# Patient Record
Sex: Male | Born: 1945 | Race: White | Hispanic: No | Marital: Married | Smoking: Former smoker
Health system: Southern US, Community
[De-identification: ages and names within clinical notes are randomized; demographics above are authoritative.]

## PROBLEM LIST (undated history)

## (undated) DIAGNOSIS — K529 Noninfective gastroenteritis and colitis, unspecified: Secondary | ICD-10-CM

## (undated) DIAGNOSIS — C61 Malignant neoplasm of prostate: Secondary | ICD-10-CM

## (undated) HISTORY — DX: Malignant neoplasm of prostate: C61

## (undated) HISTORY — DX: Noninfective gastroenteritis and colitis, unspecified: K52.9

---

## 2004-07-05 ENCOUNTER — Ambulatory Visit (HOSPITAL_COMMUNITY): Admission: RE | Admit: 2004-07-05 | Discharge: 2004-07-05 | Payer: Self-pay | Admitting: Internal Medicine

## 2004-07-05 ENCOUNTER — Ambulatory Visit: Payer: Self-pay | Admitting: Internal Medicine

## 2004-07-05 HISTORY — PX: COLONOSCOPY: SHX174

## 2008-10-15 DIAGNOSIS — K5289 Other specified noninfective gastroenteritis and colitis: Secondary | ICD-10-CM

## 2008-10-15 DIAGNOSIS — R109 Unspecified abdominal pain: Secondary | ICD-10-CM | POA: Insufficient documentation

## 2008-10-15 DIAGNOSIS — Z8719 Personal history of other diseases of the digestive system: Secondary | ICD-10-CM

## 2008-10-15 DIAGNOSIS — R198 Other specified symptoms and signs involving the digestive system and abdomen: Secondary | ICD-10-CM

## 2008-10-21 ENCOUNTER — Ambulatory Visit: Payer: Self-pay | Admitting: Internal Medicine

## 2008-10-22 ENCOUNTER — Encounter: Payer: Self-pay | Admitting: Internal Medicine

## 2008-11-24 ENCOUNTER — Ambulatory Visit: Payer: Self-pay | Admitting: Internal Medicine

## 2008-11-24 ENCOUNTER — Ambulatory Visit (HOSPITAL_COMMUNITY): Admission: RE | Admit: 2008-11-24 | Discharge: 2008-11-24 | Payer: Self-pay | Admitting: Internal Medicine

## 2008-11-24 ENCOUNTER — Encounter: Payer: Self-pay | Admitting: Internal Medicine

## 2008-11-24 HISTORY — PX: COLONOSCOPY: SHX174

## 2008-11-26 ENCOUNTER — Encounter: Payer: Self-pay | Admitting: Internal Medicine

## 2008-11-27 ENCOUNTER — Telehealth (INDEPENDENT_AMBULATORY_CARE_PROVIDER_SITE_OTHER): Payer: Self-pay

## 2008-12-01 ENCOUNTER — Encounter: Payer: Self-pay | Admitting: Internal Medicine

## 2008-12-08 ENCOUNTER — Telehealth (INDEPENDENT_AMBULATORY_CARE_PROVIDER_SITE_OTHER): Payer: Self-pay

## 2009-03-13 ENCOUNTER — Telehealth (INDEPENDENT_AMBULATORY_CARE_PROVIDER_SITE_OTHER): Payer: Self-pay

## 2009-04-24 ENCOUNTER — Ambulatory Visit (HOSPITAL_COMMUNITY): Admission: RE | Admit: 2009-04-24 | Discharge: 2009-04-24 | Payer: Self-pay | Admitting: Urology

## 2009-04-28 ENCOUNTER — Telehealth (INDEPENDENT_AMBULATORY_CARE_PROVIDER_SITE_OTHER): Payer: Self-pay | Admitting: *Deleted

## 2009-05-12 ENCOUNTER — Ambulatory Visit: Admission: RE | Admit: 2009-05-12 | Discharge: 2009-06-15 | Payer: Self-pay | Admitting: Radiation Oncology

## 2009-08-07 ENCOUNTER — Ambulatory Visit: Admission: RE | Admit: 2009-08-07 | Discharge: 2009-11-05 | Payer: Self-pay | Admitting: Radiation Oncology

## 2009-08-28 ENCOUNTER — Ambulatory Visit (HOSPITAL_BASED_OUTPATIENT_CLINIC_OR_DEPARTMENT_OTHER): Admission: RE | Admit: 2009-08-28 | Discharge: 2009-08-28 | Payer: Self-pay | Admitting: Urology

## 2009-11-10 ENCOUNTER — Ambulatory Visit: Admission: RE | Admit: 2009-11-10 | Discharge: 2009-12-02 | Payer: Self-pay | Admitting: Radiation Oncology

## 2010-01-26 ENCOUNTER — Ambulatory Visit: Payer: Self-pay | Admitting: Internal Medicine

## 2010-01-27 ENCOUNTER — Encounter: Payer: Self-pay | Admitting: Internal Medicine

## 2010-04-02 ENCOUNTER — Encounter (INDEPENDENT_AMBULATORY_CARE_PROVIDER_SITE_OTHER): Payer: Self-pay

## 2010-04-06 NOTE — Progress Notes (Signed)
Summary: CA Dx, would like to discuss how may affect GI treatment/MM  Phone Note Call from Patient Call back at Home Phone [phone]   Caller: Patient Call For: Dr. Jena Gauss Reason for Call: Talk to Doctor Details for Reason: Requested appt to discuss prostate cancer treatment w/ RMR prior to his urology appt; however, RMR has no office days prior to that date. Summary of Call: Zachary Bush asked if we could get a message to Dr. Elvera Lennox RE: his recent finding of prostate cancer and subsequent treatment.  He is currently being treated for mild colitis with Asacol 400mg  X 6 daily.  His prostate CA options have been described to him as radical surgery and / or radiation (seed implantation ERBT).  He would like opinion on how these treatments may affect his colitis (radiation mostly curious about b/c thats the way he is leaning).  Also mentioned if something may have to be done to his medications and just wanted the opportunity to run this by RMR prior to his urology appt. on March 3rd.  Please advise or contact patient to discuss. Initial call taken by: Minna Merritts,  April 28, 2009 4:07 PM  Follow-up for Phone Call        Provider Notified via documents please contact with follow up at phone # [phone] - pt states ok to leave voice mail if necessary     Appended Document: CA Dx, would like to discuss how may affect GI treatment/MM I spoke to Zachary Bush via phone.  Discussed GI and prostate issues.  I see no absolute contraindication to radiation therapy in this setting.  I did suggest he speak to radiation oncologist if XRT becomes a real option for him

## 2010-04-06 NOTE — Miscellaneous (Signed)
Summary: Orders Update  Clinical Lists Changes  Orders: Added new Test order of T-Creatinine Blood ([id number]) - Signed 

## 2010-04-06 NOTE — Progress Notes (Signed)
Summary: ? about Asacol  Phone Note Call from Patient Call back at Home Phone [phone]   Caller: Patient Summary of Call: pt called- he is about to run out of Asacol and wants to know if he should continue to take it. If he needs to continue he will need another Rx. please advise. Initial call taken by: Hendricks Limes LPN,  March 13, 2009 4:40 PM

## 2010-04-06 NOTE — Assessment & Plan Note (Signed)
Summary: fu on Asacol, questions about having a flex sig/ss   Visit Type:  Follow-up Visit Primary Care Provider:  Dimas Aguas  Chief Complaint:  F/U med check.  History of Present Illness: Followup sigmoid colitis on colonoscopy. Biopsies consistent with inflammatory bowel diseasea. 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 He has been on Asacol 800 mg b.i.d.- t.i.d. recently.No more bleeding, no diarrhea. 1 to 2 bowel movements daily.  No abdominal pain. He wonders if he could drop back to 2 tablets twice a day. He tells me he gets his medication from [place] i-generic mesalamine 400 mg tablets very cheaply; wants to know what I think about it this. Would like to know how long he'll have to remain on this medication. He's had symptoms really for  release 5 years or so; had inflammatory changes in 2006 on colonoscopy. Family history significant in that he had a first degree relative with colon cancer diagnosed at age 60. history of  prostate cancer and has had radiation seed implants placed previously. Tolerating Asacol well. Had a normal creatinine one year ago. He is slated to have a creatinine done in March of next year.    Current Medications (verified): 1)  Glucosamine .... Once Daily 2)  Asacol 400 Mg Tbec (Mesalamine) .... 2 By Mouth Three Times A Day 3)  Aspir-Low 81 Mg Tbec (Aspirin) .... As Needed For Pain  Allergies (verified): No Known Drug Allergies  Past History:  Past Surgical History: Last updated: 10/15/2008 None  Family History: Last updated: November 18, 2008 Father: Deceased age 67  CVA Mother: Deceased age 58  CVA Siblings: One brother and one sister  both healthy  Social History: Last updated: 11/18/2008 Marital Status: Married Children: one Occupation: Retired  Production designer, theatre/television/film Patient is a former smoker.  Alcohol Use - yes Illicit Drug Use - no Patient gets regular exercise.  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 At the problem and had taken a generic equivalent from [place].  Recommendations: Asacol 800 mg t.i.d. for the next 6 months ; we can then consider dropping the dose back to 2 tablets  twice daily for another 6 months and go from there. Serum creatinine in 3 months; will be drawn with PSA (elsewhere).  Unless something comes up, will plan to see this nice gentleman back in 6 months.  Given positive family history of colon cancer, will plan for another colonoscopy in 4 years.  Appended Document: Orders Update    Clinical Lists Changes  Orders: Added new Service order of Est. Patient Level III (20254) - Signed

## 2010-04-08 NOTE — Letter (Signed)
Summary: Recall, Labs Needed  [facility]  [address]   [place], [place] [address]   Phone: [phone]  Fax: [phone]    April 02, 2010  Zachary Bush [address] [place], [place]  [address] 1945-09-25   Dear [name],   Our records indicate it is time to repeat your blood work.  You can take the enclosed form to the lab on or near the date indicated.  Please make note of the new location of the lab:   [address], 2nd floor   [facility]  Our office will call you within a week to ten business days with the results.  If you do not hear from [place] in 10 business days, you should call the office.  If you have any questions regarding this, call the office at [phone], and ask for the nurse.  Labs are due on 04/29/10.   Sincerely,    Hendricks Limes LPN  [facility] Ph: [phone]   Fax: [phone]

## 2010-04-14 ENCOUNTER — Telehealth (INDEPENDENT_AMBULATORY_CARE_PROVIDER_SITE_OTHER): Payer: Self-pay

## 2010-04-22 NOTE — Progress Notes (Addendum)
Summary: ? about lab order  Phone Note Call from Patient Call back at Home Phone [phone]   Caller: Patient Summary of Call: pt called- received lab recall letter. pt has changed his mind since OV and will not be getting generic asacol from [place]. He stated the prices were about the same. pt wants to know if he still needs creatinine drawn. please advise Initial call taken by: Hendricks Limes LPN,  April 14, 2010 3:20 PM     Appended Document: ? about lab order everyone on mesalamine therapy should have a periodic assessment of renal function via serum creatinine determination.  Appended Document: ? about lab order pt said he is scheduled for other labs on 05/24/2010 and will get Creatinine checked then.  Appended Document: ? about lab order ok  Appended Document: ? about lab order pt called- his creatinine was 0.66.

## 2010-06-01 ENCOUNTER — Telehealth: Payer: Self-pay

## 2010-06-01 NOTE — Telephone Encounter (Signed)
Pt called- he left voicemail and stated he was calling to give RMR his last creatinine results. Pt had blood drawn on the 19th of March and his creatinine was 0.66. Per last phone note in [facility] pt needed it because of him taking Asacol.

## 2010-06-02 NOTE — Telephone Encounter (Signed)
Creatinine reported to be normal; need a hard copy to EMR

## 2010-06-03 NOTE — Telephone Encounter (Signed)
Called pt- LMOM asked him to let us know where he had labs done at so we could get a copy.

## 2010-06-04 NOTE — Telephone Encounter (Signed)
Requested 06/04/10/law

## 2010-06-04 NOTE — Telephone Encounter (Signed)
Pt called back, he got labs done at [facility]. Please request copy

## 2010-07-01 ENCOUNTER — Encounter: Payer: Self-pay | Admitting: Internal Medicine

## 2010-07-23 NOTE — Op Note (Signed)
NAME:  CAMDYN, BESKE             ACCOUNT NO.:  [id number]   MEDICAL RECORD NO.:  [id number]          PATIENT TYPE:  AMB   LOCATION:  DAY                           FACILITY:  [facility]   PHYSICIAN:  R. Roetta Sessions, M.D. DATE OF BIRTH:  [birth year]   DATE OF PROCEDURE:  07/05/2004  DATE OF DISCHARGE:                                 OPERATIVE REPORT   PROCEDURE PERFORMED:  Colonoscopy.   INDICATIONS FOR PROCEDURE:  The patient is a 65 year old gentleman with  positive family history of colorectal cancer in his sister in her 30s.  Mr.  Gunner last colonoscopy was at [facility] in 2000 when the patient  was found to have no significant abnormalities.  He is devoid of any lower  GI tract symptoms.  He is here for high risk screening.  This approach has  been discussed with the patient at length, potential risks, benefits and  alternatives have reviewed and questions answered.  He is agreeable.  Please  see documentation in medical record.   PROCEDURE NOTE:  Oxygen saturations, blood pressure, pulse and respirations  were monitored throughout the entirety of the procedure.  Conscious sedation  was Versed 4 mg IV, Demerol 100 mg IV in divided doses.   INSTRUMENT USED:  Olympus video chip system.   FINDINGS:  Digital rectal exam revealed no abnormalities.   ENDOSCOPIC ASSESSMENT:  Prep was good.   Rectum:  Examination of rectal mucosa including retroflex view of the anal  verge revealed no abnormalities.  Colon:  The colonic mucosa was surveyed from the rectosigmoid junction  through the left, transverse and right colon to the area of the appendiceal  orifice and ileocecal valve and cecum.  These structures were well seen and  photographed for the record.  From this level  the scope was slowly  withdrawn.  All previously imaged mucosal surfaces were again seen.  The  patient had a few scattered submucosal petechiae in the descending colon.  Otherwise colonic mucosa appeared  entirely normal.  The patient tolerated  the procedure well and was reacted in endoscopy.   IMPRESSION:  1.  Normal rectum.  2.  A few scattered areas of submucosal petechiae of descending colon.  I      doubt the clinical significance.      Otherwise normal colon.  Not mentioned above, terminal ileum was      intubated and distal 5 cm appeared normal.  Today's findings are      reassuring.   RECOMMENDATIONS:  Repeat high risk screening colonoscopy in five years.      RMR/MEDQ  D:  07/05/2004  T:  07/05/2004  Job:  [id number]   cc:   Selinda Flavin  [address]  [place]  [place] [address]  Fax: [phone]

## 2010-07-26 ENCOUNTER — Encounter: Payer: Self-pay | Admitting: Internal Medicine

## 2010-09-15 ENCOUNTER — Telehealth: Payer: Self-pay

## 2010-09-15 NOTE — Telephone Encounter (Signed)
Pt called- he was getting asacol from [place] d/t he didn't have any insurance since November 2011. Pt now has medicare part d and is requesting the rest of his refills sent to [facility]- mail order company for [employer]. [phone] fax #. Pt needs 90 day supplys. [place] has his original rx and cannot transfer the rest of his refills. Will need to be printed and faxed in.

## 2010-09-16 NOTE — Telephone Encounter (Signed)
Pt aware, he stated he doesn't have time for an ov and he would just continue to get it the way he's been getting it.

## 2010-09-16 NOTE — Telephone Encounter (Signed)
Dr Jena Gauss had planned on adjusting pt's dose of asacol since his last OV.  He needs OV 1st please to discuss.  Thanks

## 2011-02-14 ENCOUNTER — Encounter: Payer: Self-pay | Admitting: Internal Medicine

## 2011-02-15 ENCOUNTER — Ambulatory Visit (INDEPENDENT_AMBULATORY_CARE_PROVIDER_SITE_OTHER): Payer: Medicare Other | Admitting: Gastroenterology

## 2011-02-15 ENCOUNTER — Encounter: Payer: Self-pay | Admitting: Gastroenterology

## 2011-02-15 VITALS — BP 123/79 | HR 48 | Temp 98.0°F | Ht 73.0 in | Wt 224.0 lb

## 2011-02-15 DIAGNOSIS — K5289 Other specified noninfective gastroenteritis and colitis: Secondary | ICD-10-CM

## 2011-02-15 MED ORDER — MESALAMINE 400 MG PO TBEC: 800.0000 mg | DELAYED_RELEASE_TABLET | Freq: Three times a day (TID) | ORAL | Status: DC

## 2011-02-15 NOTE — Progress Notes (Signed)
Cc to PCP 

## 2011-02-15 NOTE — Patient Instructions (Signed)
Continue Asacol as before. You can decrease to two pills twice a day anytime you are ready. Return to office in one year or sooner if needed.

## 2011-02-15 NOTE — Assessment & Plan Note (Signed)
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 Patient can drop the dose to twice a day at any time. He prefers keeping 3 times a day dosing for now. We will get a copy of his last creatinine for our records. Office visit in one year or sooner if needed.

## 2011-02-15 NOTE — Progress Notes (Signed)
Primary Care Physician: Criss Rosales, MD  Primary Gastroenterologist:  Roetta Sessions, MD   Chief Complaint  Patient presents with  . Follow-up  . Medication Refill    HPI: Zachary Bush is a 65 y.o. male here for followup of colitis. He has indeterminate left-sided colitis, diagnosed in September 2010. He has been maintained on Asacol. He states he's doing very well. Denies abdominal pain, diarrhea, melena, rectal bleeding, weight loss, nausea or vomiting, heartburn, dysphagia. He continues to take Asacol 800 mg 3 times a day. He has not been interested in dropping back to twice daily dosing as he's been doing well and does not want to "rock the boat". He tells me had his yearly blood work in October by Dr. Selinda Flavin and everything looked good.  Current Outpatient Prescriptions  Medication Sig Dispense Refill  . mesalamine (ASACOL) 400 MG EC tablet Take 400 mg by mouth 3 (three) times daily.          Allergies as of 02/15/2011  . (No Known Allergies)    ROS:  General: Negative for anorexia, weight loss, fever, chills, fatigue, weakness. ENT: Negative for hoarseness, difficulty swallowing , nasal congestion. CV: Negative for chest pain, angina, palpitations, dyspnea on exertion, peripheral edema.  Respiratory: Negative for dyspnea at rest, dyspnea on exertion, cough, sputum, wheezing.  GI: See history of present illness. GU:  Negative for dysuria, hematuria, urinary incontinence, urinary frequency, nocturnal urination.  Endo: Negative for unusual weight change.    Physical Examination:   BP 123/79  Pulse 48  Temp(Src) 98 F (36.7 C) (Temporal)  Ht 6\' 1"  (1.854 m)  Wt 224 lb (101.606 kg)  BMI 29.55 kg/m2  General: Well-nourished, well-developed in no acute distress.  Eyes: No icterus. Mouth: Oropharyngeal mucosa moist and pink , no lesions erythema or exudate. Lungs: Clear to auscultation bilaterally.  Heart: Regular rate and rhythm, no murmurs rubs or gallops.    Abdomen: Bowel sounds are normal, nontender, nondistended, no hepatosplenomegaly or masses, no abdominal bruits or hernia , no rebound or guarding.   Extremities: No lower extremity edema. No clubbing or deformities. Neuro: Alert and oriented x 4   Skin: Warm and dry, no jaundice.   Psych: Alert and cooperative, normal mood and affect.

## 2011-03-08 NOTE — Progress Notes (Signed)
Labs 12/2010: Creatinine 1.03. Tbili 0.3, AP 81, AST 18, ALT 20, WBC 5800, H/H 13.8/41.9, Plt 209000

## 2012-02-06 ENCOUNTER — Encounter: Payer: Self-pay | Admitting: Internal Medicine

## 2012-02-10 ENCOUNTER — Telehealth: Payer: Self-pay | Admitting: Internal Medicine

## 2012-02-10 NOTE — Telephone Encounter (Signed)
Pt has questions about his prescription Asacol because his insurance is changing and has multiple questions about mail order and would like to get a written script of this, I told him that I would have the nurse that handles this call him back on Monday at [phone]

## 2012-02-13 NOTE — Telephone Encounter (Signed)
LMOM for a return call to set up OV

## 2012-02-13 NOTE — Telephone Encounter (Signed)
Pt is due for office visit. Zachary Bush is going to call and schedule.

## 2012-02-14 NOTE — Telephone Encounter (Signed)
Pt is aware of OV on 12/16 at 0830 with AS

## 2012-02-16 ENCOUNTER — Encounter: Payer: Self-pay | Admitting: Internal Medicine

## 2012-02-20 ENCOUNTER — Encounter: Payer: Self-pay | Admitting: Gastroenterology

## 2012-02-20 ENCOUNTER — Ambulatory Visit (INDEPENDENT_AMBULATORY_CARE_PROVIDER_SITE_OTHER): Payer: Medicare Other | Admitting: Gastroenterology

## 2012-02-20 VITALS — BP 129/63 | HR 49 | Temp 97.9°F | Ht 72.0 in | Wt 221.8 lb

## 2012-02-20 DIAGNOSIS — K5289 Other specified noninfective gastroenteritis and colitis: Secondary | ICD-10-CM

## 2012-02-20 MED ORDER — MESALAMINE 400 MG PO CPDR: 800.0000 mg | DELAYED_RELEASE_CAPSULE | Freq: Three times a day (TID) | ORAL | Status: DC

## 2012-02-20 NOTE — Progress Notes (Signed)
Referring Provider: Selinda Flavin, MD Primary Care Physician:  Selinda Flavin, MD Primary GI: Dr. Jena Gauss   Chief Complaint  Patient presents with  . Follow-up    doing good/need a Rx writen out    HPI:   66 year old male presents today in f/u of left-sided indeterminate colitis. He has been maintained on Asacol 800 tid; he has been hesitant to decrease dose to BID. He presents today for routine follow-up and further prescriptions. Would like to keep at TID. Has been playing around with it. No change in bowel habits, no rectal bleeding.   Past Medical History  Diagnosis Date  . Colitis     Biopsies indeterminate, doubt Crohn's  . Prostate cancer     seed implants 08/2009    Past Surgical History  Procedure Date  . Colonoscopy 11/24/08    normal rectum, intense patchy areas of submucosal petechiae with overlying erosions, with small areas of intervening normal-appearing mucosa from sigmoid colon to rectosigmoid junction. Bx-favor IBDSSurveillance Sept 2015  . Colonoscopy  07/05/2004    RMR: Normal rectum/  A few scattered areas of submucosal petechiae of descending colon/ Otherwise normal colon    Current Outpatient Prescriptions  Medication Sig Dispense Refill  . Mesalamine (DELZICOL) 400 MG CPDR Take 2 capsules (800 mg total) by mouth 3 (three) times daily.  410 capsule  3    Allergies as of 02/20/2012  . (No Known Allergies)    Family History  Problem Relation Age of Onset  . Stroke Father 65    deceased  . Stroke Mother 41    deceased  . Colon cancer Sister 81    History   Social History  . Marital Status: Married    Spouse Name: N/A    Number of Children: N/A  . Years of Education: N/A   Occupational History  . retired Production designer, theatre/television/film    Social History Main Topics  . Smoking status: Former Games developer  . Smokeless tobacco: None  . Alcohol Use: No  . Drug Use: No  . Sexually Active: None   Other Topics Concern  . None   Social History Narrative  . None     Review of Systems: Gen: Denies fever, chills, anorexia. Denies fatigue, weakness, weight loss.  CV: Denies chest pain, palpitations, syncope, peripheral edema, and claudication. Resp: Denies dyspnea at rest, cough, wheezing, coughing up blood, and pleurisy. GI: SEE HPI Derm: Denies rash, itching, dry skin Psych: Denies depression, anxiety, memory loss, confusion. No homicidal or suicidal ideation.  Heme: Denies bruising, bleeding, and enlarged lymph nodes.  Physical Exam: BP 129/63  Pulse 49  Temp 97.9 F (36.6 C) (Oral)  Ht 6' (1.829 m)  Wt 221 lb 12.8 oz (100.608 kg)  BMI 30.08 kg/m2 General:   Alert and oriented. No distress noted. Pleasant and cooperative.  Head:  Normocephalic and atraumatic. Eyes:  Conjuctiva clear without scleral icterus. Mouth:  Oral mucosa pink and moist. Good dentition. No lesions. Neck:  Supple, without mass or thyromegaly. Heart:  S1, S2 present without murmurs, rubs, or gallops. Regular rate and rhythm. Abdomen:  +BS, soft, non-tender and non-distended. No rebound or guarding. No HSM or masses noted. Msk:  Symmetrical without gross deformities. Normal posture. Extremities:  Without edema. Neurologic:  Alert and  oriented x4;  grossly normal neurologically. Skin:  Intact without significant lesions or rashes. Cervical Nodes:  No significant cervical adenopathy. Psych:  Alert and cooperative. Normal mood and affect.

## 2012-02-20 NOTE — Assessment & Plan Note (Signed)
66 year old male with hx of left-sided indeterminate colitis, doing well on Dezicol 800 mg TID. Have informed pt may decrease to BID if tolerated. Due for surveillance TCS in Sept 2015. Prescription provided, return in 2 years. Set up TCS at that time.

## 2012-02-20 NOTE — Progress Notes (Signed)
Faxed to PCP

## 2012-02-20 NOTE — Patient Instructions (Addendum)
Continue taking Delzicol 2 capsules three times a day. You may decrease this to 2 capsules twice a day if tolerated.   We will see you back in 2 years!  Merry Christmas!

## 2012-11-29 NOTE — Progress Notes (Signed)
Labs from October 2013:   BUN and Cr normal.  LFTs normal, to be abstracted.  Hgb 13.8

## 2012-12-03 LAB — CBC
HCT: 40 %
HGB: 13.8 g/dL
platelet count: 204

## 2013-03-13 ENCOUNTER — Encounter: Payer: Self-pay | Admitting: Gastroenterology

## 2013-03-13 ENCOUNTER — Ambulatory Visit (INDEPENDENT_AMBULATORY_CARE_PROVIDER_SITE_OTHER): Payer: Medicare Other | Admitting: Gastroenterology

## 2013-03-13 ENCOUNTER — Encounter (INDEPENDENT_AMBULATORY_CARE_PROVIDER_SITE_OTHER): Payer: Self-pay

## 2013-03-13 VITALS — BP 139/74 | HR 46 | Temp 98.0°F | Wt 215.8 lb

## 2013-03-13 DIAGNOSIS — K5289 Other specified noninfective gastroenteritis and colitis: Secondary | ICD-10-CM

## 2013-03-13 MED ORDER — MESALAMINE 400 MG PO CPDR: 800.0000 mg | DELAYED_RELEASE_CAPSULE | Freq: Three times a day (TID) | ORAL | Status: AC

## 2013-03-13 NOTE — Progress Notes (Signed)
    Referring Provider: Rory Percy, MD Primary Care Physician:  Rory Percy, MD Primary GI: Dr. Gala Romney  Chief Complaint  Patient presents with  . Medication Refill    HPI:   Zachary Bush presents today in follow-up of left-sided indeterminate colitis. Maintained on Delzicol 800 TID, unable to tolerate dosing at BID. Here for annual follow-up.   Denies abdominal pain, flares. Tried to decrease Delzicol down to BID and could not tolerate. No rectal bleeding. Normally one BM a day, sometimes two. No reflux or heartburn. No dysphagia.    Past Medical History  Diagnosis Date  . Colitis     Biopsies indeterminate, doubt Crohn's  . Prostate cancer     seed implants 08/2009    Past Surgical History  Procedure Laterality Date  . Colonoscopy  11/24/08    normal rectum, intense patchy areas of submucosal petechiae with overlying erosions, with small areas of intervening normal-appearing mucosa from sigmoid colon to rectosigmoid junction. Bx-favor IBDSSurveillance Sept 2015  . Colonoscopy   07/05/2004    RMR: Normal rectum/  A few scattered areas of submucosal petechiae of descending colon/ Otherwise normal colon    Current Outpatient Prescriptions  Medication Sig Dispense Refill  . Mesalamine (DELZICOL) 400 MG CPDR Take 2 capsules (800 mg total) by mouth 3 (three) times daily.  410 capsule  3   No current facility-administered medications for this visit.    Allergies as of 03/13/2013  . (No Known Allergies)    Family History  Problem Relation Age of Onset  . Stroke Father 78    deceased  . Stroke Mother 84    deceased  . Colon cancer Sister 58    History   Social History  . Marital Status: Married    Spouse Name: N/A    Number of Children: N/A  . Years of Education: N/A   Occupational History  . retired Freight forwarder    Social History Main Topics  . Smoking status: Former Research scientist (life sciences)  . Smokeless tobacco: None  . Alcohol Use: No  . Drug Use: No  . Sexual  Activity: None   Other Topics Concern  . None   Social History Narrative  . None    Review of Systems: Negative unless mentioned in HPI.  Physical Exam: BP 139/74  Pulse 46  Temp(Src) 98 F (36.7 C) (Oral)  Wt 215 lb 12.8 oz (97.886 kg) General:   Alert and oriented. No distress noted. Pleasant and cooperative.  Head:  Normocephalic and atraumatic. Eyes:  Conjuctiva clear without scleral icterus. Mouth:  Oral mucosa pink and moist. Good dentition. No lesions. Heart:  S1, S2 present without murmurs, rubs, or gallops. Regular rate and rhythm. Abdomen:  +BS, soft, non-tender and non-distended. No rebound or guarding. No HSM or masses noted. Msk:  Symmetrical without gross deformities. Normal posture. Extremities:  Without edema. Neurologic:  Alert and  oriented x4;  grossly normal neurologically. Skin:  Intact without significant lesions or rashes. Cervical Nodes:  No significant cervical adenopathy. Psych:  Alert and cooperative. Normal mood and affect.

## 2013-03-13 NOTE — Assessment & Plan Note (Signed)
History of indeterminate left-sided colitis in clinical remission on Delzicol 800 TID. Unable to decrease dosage. From a GI perspective, doing well.  Next surveillance colonoscopy due in Nov 2015. Will have patient return in Oct this year to schedule. Will also obtain outside labs to include renal and hepatic function.

## 2013-03-13 NOTE — Patient Instructions (Signed)
Continue to take Delzicol 2 capsules three times a day.   We will see you back around October to schedule your colonoscopy.   Have a great 2015!

## 2013-03-14 NOTE — Progress Notes (Signed)
cc'd to pcp 

## 2013-06-12 NOTE — Progress Notes (Signed)
Outside labs reviewed from Sept 2014. Good renal function, normal LFTs, Hgb 13.6. Will be abstracted.

## 2013-06-18 LAB — COMPREHENSIVE METABOLIC PANEL
ALT: 19 U/L (ref 10–40)
AST: 17 U/L
Alkaline Phosphatase: 71 U/L
BUN: 13 mg/dL (ref 4–21)
Creat: 0.99
HEMATOCRIT: 41 %
HGB: 13.6 g/dL
Total Bilirubin: 0.4 mg/dL

## 2013-09-09 ENCOUNTER — Telehealth: Payer: Self-pay | Admitting: Internal Medicine

## 2013-09-09 NOTE — Telephone Encounter (Signed)
Patient is on recall to have his next tcs in Sept 2015 with RMR, but he is moving and wants to wrap things up here before relocating. He would like to be scheduled this month possibly on a Monday and he prefers mornings.  He did say that last time RMR prescribed him the prep in pill form and requested that he takes that again. I told him normally RMR doesn't do that, but I would document the request for a pill  Prep instead of liquid. Please call [phone] or [phone]. He did give permission to speak with his wife, Thayer Headings, if he wasn't available.

## 2013-09-10 NOTE — Telephone Encounter (Signed)
I called pt and told him I will check and see if he can be triaged and scheduled. I will try to call him back by tomorrow.  Zachary Bush, please advise!

## 2013-09-10 NOTE — Telephone Encounter (Signed)
Per Vicente Males  it's okay to triage if the patient doesn't have any other GI problems.   Routing to [employer]

## 2013-09-10 NOTE — Telephone Encounter (Signed)
History of indeterminate left-sided colitis in clinical remission on Delzicol 800 TID. Unable to decrease dosage. From a GI perspective, doing well. Next surveillance colonoscopy due this year. I am copying Rosendo Gros on this. It is a surveillance colonoscopy; I don't know how this one should be handled.

## 2013-09-10 NOTE — Telephone Encounter (Signed)
No, do not recommend pills.

## 2013-09-10 NOTE — Telephone Encounter (Signed)
Zachary Bush, is it ok to triage?

## 2013-09-11 ENCOUNTER — Other Ambulatory Visit: Payer: Self-pay

## 2013-09-11 ENCOUNTER — Telehealth: Payer: Self-pay

## 2013-09-11 DIAGNOSIS — Z1211 Encounter for screening for malignant neoplasm of colon: Secondary | ICD-10-CM

## 2013-09-12 NOTE — Telephone Encounter (Signed)
Gastroenterology Pre-Procedure Review  Request Date: 09/11/2013 Requesting Physician:   Pt's last colonoscopy was done by Dr.Rourk 11/24/2008 and next due in 5 years He will be moving later in August and request to have it done now  PATIENT REVIEW QUESTIONS: The patient responded to the following health history questions as indicated:    1. Diabetes Melitis: no 2. Joint replacements in the past 12 months: no 3. Major health problems in the past 3 months: no 4. Has an artificial valve or MVP: no 5. Has a defibrillator: no 6. Has been advised in past to take antibiotics in advance of a procedure like teeth cleaning: no    MEDICATIONS & ALLERGIES:    Patient reports the following regarding taking any blood thinners:   Plavix? no Aspirin? no Coumadin? no  Patient confirms/reports the following medications:  Current Outpatient Prescriptions  Medication Sig Dispense Refill  . Mesalamine (DELZICOL) 400 MG CPDR DR capsule Take 2 capsules (800 mg total) by mouth 3 (three) times daily.  540 capsule  3   No current facility-administered medications for this visit.    Patient confirms/reports the following allergies:  No Known Allergies  No orders of the defined types were placed in this encounter.    AUTHORIZATION INFORMATION Primary Insurance:   ID #:  Group #:  Pre-Cert / Auth required: Pre-Cert / Auth #:   Secondary Insurance:  ID #:   Group #:  Pre-Cert / Auth required:  Pre-Cert / Auth #:   SCHEDULE INFORMATION: Procedure has been scheduled as follows:  Date: 10/16/2013                 Time:  8:30 AM Location: [facility] Short Stay  This Gastroenterology Pre-Precedure Review Form is being routed to the following provider(s): R. Garfield Cornea, MD

## 2013-09-12 NOTE — Telephone Encounter (Signed)
History of left-sided indeterminate colitis. Maintained on Delzicol 800 TID. Due for surveillance now. Appropriate for triage, cleared with office manager to triage.

## 2013-09-13 MED ORDER — PEG-KCL-NACL-NASULF-NA ASC-C 100 G PO SOLR: 1.0000 | ORAL | Status: AC

## 2013-09-13 NOTE — Telephone Encounter (Signed)
Rx sent to the pharmacy and instructions mailed to pt.  

## 2013-10-07 ENCOUNTER — Telehealth: Payer: Self-pay

## 2013-10-07 NOTE — Telephone Encounter (Signed)
Tried to call pt to update triage. He is scheduled for colonoscopy on 10/16/2013 at 8:30.  Many rings and no answer. Mailing letter for him to call to update triage.

## 2013-10-08 ENCOUNTER — Encounter (HOSPITAL_COMMUNITY): Payer: Self-pay | Admitting: Pharmacy Technician

## 2013-10-14 ENCOUNTER — Telehealth: Payer: Self-pay

## 2013-10-14 NOTE — Telephone Encounter (Signed)
Pt called and said he wants to make sure that he does not get general anesthesia for his prep on 10/16/2013. I told him he is scheduled for Endo and they are just given some meds in IV to relax. He states that he is not allergic to any meds, but he had procedure once and was knocked out and he will NOT be happy if he is knocked out.  He said if it does not go well, he will call the president to complain. Please advise!

## 2013-10-14 NOTE — Telephone Encounter (Signed)
Noted  

## 2013-10-14 NOTE — Telephone Encounter (Signed)
I spoke with patient. He received versed 6mg , demerol 125mg  at time of TCS in 2010. He felt very "wiped out" and "hung over" for couple of days and doesn't want to feel like that again.   He felt great after his TCS in 2006 when he received, Versed 4mg /demerol 100mg . And in 2001, versed 2mg /demerol 50mg .   He would like to use smallest amount of sedation as possible just to knock "edge off". He doesn't mind being awake.   I will inform Dr. Gala Romney and asked patient to mention day of procedure as well.

## 2013-10-15 NOTE — Telephone Encounter (Signed)
LMOM to call and let us know if there has been any change in meds. Also, I left VM that Magda Paganini has made his request known to Dr. Gala Romney in reference to the sedation for his procedure.

## 2013-10-16 ENCOUNTER — Ambulatory Visit (HOSPITAL_COMMUNITY)
Admission: RE | Admit: 2013-10-16 | Discharge: 2013-10-16 | Disposition: A | Discharge status: Home / Self Care | Payer: Medicare Other | Source: Ambulatory Visit | Attending: Internal Medicine | Admitting: Internal Medicine

## 2013-10-16 ENCOUNTER — Encounter (HOSPITAL_COMMUNITY): Payer: Self-pay | Admitting: *Deleted

## 2013-10-16 ENCOUNTER — Encounter (HOSPITAL_COMMUNITY)
Admission: RE | Disposition: A | Discharge status: Home / Self Care | Payer: Self-pay | Source: Ambulatory Visit | Attending: Internal Medicine

## 2013-10-16 DIAGNOSIS — Z87891 Personal history of nicotine dependence: Secondary | ICD-10-CM | POA: Insufficient documentation

## 2013-10-16 DIAGNOSIS — D126 Benign neoplasm of colon, unspecified: Secondary | ICD-10-CM | POA: Insufficient documentation

## 2013-10-16 DIAGNOSIS — Z1211 Encounter for screening for malignant neoplasm of colon: Secondary | ICD-10-CM | POA: Diagnosis present

## 2013-10-16 DIAGNOSIS — Z86010 Personal history of colonic polyps: Secondary | ICD-10-CM

## 2013-10-16 DIAGNOSIS — Z8546 Personal history of malignant neoplasm of prostate: Secondary | ICD-10-CM | POA: Insufficient documentation

## 2013-10-16 DIAGNOSIS — K5289 Other specified noninfective gastroenteritis and colitis: Secondary | ICD-10-CM | POA: Insufficient documentation

## 2013-10-16 DIAGNOSIS — Z800 Family history of malignant neoplasm of digestive organs: Secondary | ICD-10-CM | POA: Insufficient documentation

## 2013-10-16 HISTORY — PX: COLONOSCOPY: SHX5424

## 2013-10-16 LAB — BASIC METABOLIC PANEL
ANION GAP: 12 (ref 5–15)
BUN: 8 mg/dL (ref 6–23)
CALCIUM: 9.1 mg/dL (ref 8.4–10.5)
CO2: 25 meq/L (ref 19–32)
CREATININE: 0.87 mg/dL (ref 0.50–1.35)
Chloride: 103 mEq/L (ref 96–112)
GFR calc Af Amer: >90 mL/min (ref >90)
GFR calc non Af Amer: 87 mL/min — ABNORMAL LOW (ref >90)
Glucose, Bld: 87 mg/dL (ref 70–99)
Potassium: 4.4 mEq/L (ref 3.7–5.3)
Sodium: 140 mEq/L (ref 137–147)

## 2013-10-16 SURGERY — COLONOSCOPY
Anesthesia: Moderate Sedation

## 2013-10-16 MED ORDER — SODIUM CHLORIDE 0.9 % IV SOLN
INTRAVENOUS | Status: DC
  Administered 2013-10-16: 08:00:00 via INTRAVENOUS

## 2013-10-16 MED ORDER — ONDANSETRON HCL 4 MG/2ML IJ SOLN
INTRAMUSCULAR | Status: AC
  Filled 2013-10-16: qty 2

## 2013-10-16 MED ORDER — STERILE WATER FOR IRRIGATION IR SOLN
Status: DC | PRN
  Administered 2013-10-16: 09:00:00

## 2013-10-16 MED ORDER — MIDAZOLAM HCL 5 MG/5ML IJ SOLN
INTRAMUSCULAR | Status: AC
  Filled 2013-10-16: qty 10

## 2013-10-16 MED ORDER — MEPERIDINE HCL 100 MG/ML IJ SOLN
INTRAMUSCULAR | Status: AC
  Filled 2013-10-16: qty 2

## 2013-10-16 NOTE — Op Note (Signed)
[facility] [address] [place], [address]   COLONOSCOPY PROCEDURE REPORT  PATIENT: Zachary Bush, Zachary Bush  MR#:         [id number] BIRTHDATE: March 19, 1945 , 68  yrs. old GENDER: Male ENDOSCOPIST: R.  Garfield Cornea, MD FACP FACG REFERRED BY:  Rory Percy, M.D. PROCEDURE DATE:  10/16/2013 PROCEDURE:     Ileocolonoscopy with snare polypectomy and biopsy  INDICATIONS: Positive family history of colon cancer; history of left-sided indeterminate colitis; history of radiation implants for prostate cancer.  Currently, patient not having any colitis symptoms, whatsoever.  INFORMED CONSENT:  The risks, benefits, alternatives and imponderables including but not limited to bleeding, perforation as well as the possibility of a missed lesion have been reviewed.  The potential for biopsy, lesion removal, etc. have also been discussed.  Questions have been answered.  All parties agreeable. Please see the history and physical in the medical record for more information.  MEDICATIONS: None given per plan.  DESCRIPTION OF PROCEDURE:  After a digital rectal exam was performed, the EC-3890Li ([id number])  colonoscope was advanced from the anus through the rectum and colon to the area of the cecum, ileocecal valve and appendiceal orifice.  The cecum was deeply intubated.  These structures were well-seen and photographed for the record.  From the level of the cecum and ileocecal valve, the scope was slowly and cautiously withdrawn.  The mucosal surfaces were carefully surveyed utilizing scope tip deflection to facilitate fold flattening as needed.  The scope was pulled down into the rectum where a thorough examination including retroflexion was performed.    FINDINGS:  Adequate preparation. Normal-appearing rectum (could not identify any neovascular changes or other evidence of radiation effect).  (1) she's diminutive polyp in the mid sigmoid segment. (1) 6 or polyp in the mid  ascending segment with (1) adjacent diminutive polyps; otherwise, the remainder of the colonic mucosa appeared entirely normal. The distal 10 cm of terminal millimeters also appeared normal.  THERAPEUTIC / DIAGNOSTIC MANEUVERS PERFORMED:  The larger ascending colon polyp was hot snare removed; the adjacent diminutive polyps was cold biopsy/ removed. The sigmoid polyp was cold biopsied/ removed. Biopsies of normal-appearing adjacent sigmoid mucosa also taken for histologic study.  COMPLICATIONS: none  CECAL WITHDRAWAL TIME:  21 minutes  IMPRESSION:  Multiple colonic polyps-removed as described above  - status post sigmoid biopsy. Endoscopically, no evidence of proctitis or colitis.  RECOMMENDATIONS: Followup on pathology. Further recommendations to follow. Check metabolic profile today to assess renal function.   _______________________________ eSigned:  R. Garfield Cornea, MD FACP [facility] 10/16/2013 9:33 AM   CC:    PATIENT NAME:  Zachary Bush, Zachary Bush MR#: [id number]

## 2013-10-16 NOTE — H&P (Signed)
_0 @   Primary Care Physician:  Rory Percy, MD Primary Gastroenterologist:  Dr. Gala Romney  Pre-Procedure History & Physical: HPI:  Zachary Bush is a 68 y.o. male is here for a screening colonoscopy. Positive family history of colon cancer in first-degree relative at a very young age. Also history of left-sided indeterminate colitis. Doing well on mesalamine. No blood per rectum, abdominal pain or diarrhea.  Here for a high risk ringing examination. Likely has had mild colitis going back to 2006. History of prostate cancer with seed implant.  Past Medical History  Diagnosis Date  . Colitis     Biopsies indeterminate, doubt Crohn's  . Prostate cancer     seed implants 08/2009    Past Surgical History  Procedure Laterality Date  . Colonoscopy  11/24/08    normal rectum, intense patchy areas of submucosal petechiae with overlying erosions, with small areas of intervening normal-appearing mucosa from sigmoid colon to rectosigmoid junction. Bx-favor IBDSSurveillance Sept 2015  . Colonoscopy   07/05/2004    RMR: Normal rectum/  A few scattered areas of submucosal petechiae of descending colon/ Otherwise normal colon    Prior to Admission medications   Medication Sig Start Date End Date Taking? Authorizing Provider  peg 3350 powder (MOVIPREP) 100 G SOLR Take 1 kit (200 g total) by mouth as directed. 09/13/13  Yes Daneil Dolin, MD  Mesalamine (DELZICOL) 400 MG CPDR DR capsule Take 2 capsules (800 mg total) by mouth 3 (three) times daily. 03/13/13   Orvil Feil, NP    Allergies as of 09/11/2013  . (No Known Allergies)    Family History  Problem Relation Age of Onset  . Stroke Father 13    deceased  . Stroke Mother 74    deceased  . Colon cancer Sister 64    History   Social History  . Marital Status: Married    Spouse Name: N/A    Number of Children: N/A  . Years of Education: N/A   Occupational History  . retired Freight forwarder    Social History Main Topics  . Smoking  status: Former Smoker -- 2.00 packs/day for 18 years    Types: Cigarettes  . Smokeless tobacco: Not on file  . Alcohol Use: 1.2 oz/week    2 Cans of beer per week     Comment: couple beers every weekend per pt   . Drug Use: No  . Sexual Activity: Not on file   Other Topics Concern  . Not on file   Social History Narrative  . No narrative on file    Review of Systems: See HPI, otherwise negative ROS  Physical Exam: BP 154/75  Temp(Src) 97.5 F (36.4 C) (Oral)  Resp 22  Ht 6' (1.829 m)  Wt 207 lb (93.895 kg)  BMI 28.07 kg/m2  SpO2 99% General:   Alert,  Well-developed, well-nourished, pleasant and cooperative in NAD Head:  Normocephalic and atraumatic. Eyes:  Sclera clear, no icterus.   Conjunctiva pink. Ears:  Normal auditory acuity. Nose:  No deformity, discharge,  or lesions. Mouth:  No deformity or lesions, dentition normal. Neck:  Supple; no masses or thyromegaly. Lungs:  Clear throughout to auscultation.   No wheezes, crackles, or rhonchi. No acute distress. Heart:  Regular rate and rhythm; no murmurs, clicks, rubs,  or gallops. Abdomen:  Soft, nontender and nondistended. No masses, hepatosplenomegaly or hernias noted. Normal bowel sounds, without guarding, and without rebound.   Msk:  Symmetrical without gross deformities. Normal posture.  Pulses:  Normal pulses noted. Extremities:  Without clubbing or edema. Neurologic:  Alert and  oriented x4;  grossly normal neurologically.  Impression/Plan: Zachary Bush is now here to undergo a high risk screening colonoscopy.  Risks, benefits, limitations, imponderables and alternatives regarding colonoscopy have been reviewed with the patient. Questions have been answered. All parties agreeable.   Sedation issues discussed. Patient desires to minimize sedation. He has an IV.  We mutually agreed to attempt examination without sedation. However, if needed, sedation will be given during the procedure.      Notice:  This  dictation was prepared with Dragon dictation along with smaller phrase technology. Any transcriptional errors that result from this process are unintentional and may not be corrected upon review.

## 2013-10-16 NOTE — Discharge Instructions (Addendum)
Colonoscopy Discharge Instructions  Read the instructions outlined below and refer to this sheet in the next few weeks. These discharge instructions provide you with general information on caring for yourself after you leave the hospital. Your doctor may also give you specific instructions. While your treatment has been planned according to the most current medical practices available, unavoidable complications occasionally occur. If you have any problems or questions after discharge, call Dr. Gala Romney at [phone]. ACTIVITY  You may resume your regular activity, but move at a slower pace for the next 24 hours.   Take frequent rest periods for the next 24 hours.   Walking will help get rid of the air and reduce the bloated feeling in your belly (abdomen).   No driving for 24 hours (because of the medicine (anesthesia) used during the test).    Do not sign any important legal documents or operate any machinery for 24 hours (because of the anesthesia used during the test).  NUTRITION  Drink plenty of fluids.   You may resume your normal diet as instructed by your doctor.   Begin with a light meal and progress to your normal diet. Heavy or fried foods are harder to digest and may make you feel sick to your stomach (nauseated).   Avoid alcoholic beverages for 24 hours or as instructed.  MEDICATIONS  You may resume your normal medications unless your doctor tells you otherwise.  WHAT YOU CAN EXPECT TODAY  Some feelings of bloating in the abdomen.   Passage of more gas than usual.   Spotting of blood in your stool or on the toilet paper.  IF YOU HAD POLYPS REMOVED DURING THE COLONOSCOPY:  No aspirin products for 7 days or as instructed.   No alcohol for 7 days or as instructed.   Eat a soft diet for the next 24 hours.  FINDING OUT THE RESULTS OF YOUR TEST Not all test results are available during your visit. If your test results are not back during the visit, make an appointment  with your caregiver to find out the results. Do not assume everything is normal if you have not heard from your caregiver or the medical facility. It is important for you to follow up on all of your test results.  SEEK IMMEDIATE MEDICAL ATTENTION IF:  You have more than a spotting of blood in your stool.   Your belly is swollen (abdominal distention).   You are nauseated or vomiting.   You have a temperature over 101.   You have abdominal pain or discomfort that is severe or gets worse throughout the day.   Polyp information provided  Further recommendations to follow pending review of pathology report  Draw B-MET today Colon Polyps Polyps are lumps of extra tissue growing inside the body. Polyps can grow in the large intestine (colon). Most colon polyps are noncancerous (benign). However, some colon polyps can become cancerous over time. Polyps that are larger than a pea may be harmful. To be safe, caregivers remove and test all polyps. CAUSES  Polyps form when mutations in the genes cause your cells to grow and divide even though no more tissue is needed. RISK FACTORS There are a number of risk factors that can increase your chances of getting colon polyps. They include:  Being older than 50 years.  Family history of colon polyps or colon cancer.  Long-term colon diseases, such as colitis or Crohn disease.  Being overweight.  Smoking.  Being inactive.  Drinking too much  alcohol. SYMPTOMS  Most small polyps do not cause symptoms. If symptoms are present, they may include:  Blood in the stool. The stool may look dark red or black.  Constipation or diarrhea that lasts longer than 1 week. DIAGNOSIS People often do not know they have polyps until their caregiver finds them during a regular checkup. Your caregiver can use 4 tests to check for polyps:  Digital rectal exam. The caregiver wears gloves and feels inside the rectum. This test would find polyps only in the  rectum.  Barium enema. The caregiver puts a liquid called barium into your rectum before taking X-rays of your colon. Barium makes your colon look white. Polyps are dark, so they are easy to see in the X-ray pictures.  Sigmoidoscopy. A thin, flexible tube (sigmoidoscope) is placed into your rectum. The sigmoidoscope has a light and tiny camera in it. The caregiver uses the sigmoidoscope to look at the last third of your colon.  Colonoscopy. This test is like sigmoidoscopy, but the caregiver looks at the entire colon. This is the most common method for finding and removing polyps. TREATMENT  Any polyps will be removed during a sigmoidoscopy or colonoscopy. The polyps are then tested for cancer. PREVENTION  To help lower your risk of getting more colon polyps:  Eat plenty of fruits and vegetables. Avoid eating fatty foods.  Do not smoke.  Avoid drinking alcohol.  Exercise every day.  Lose weight if recommended by your caregiver.  Eat plenty of calcium and folate. Foods that are rich in calcium include milk, cheese, and broccoli. Foods that are rich in folate include chickpeas, kidney beans, and spinach. HOME CARE INSTRUCTIONS Keep all follow-up appointments as directed by your caregiver. You may need periodic exams to check for polyps. SEEK MEDICAL CARE IF: You notice bleeding during a bowel movement.

## 2013-10-18 ENCOUNTER — Encounter: Payer: Self-pay | Admitting: Internal Medicine

## 2013-10-25 ENCOUNTER — Encounter (HOSPITAL_COMMUNITY): Payer: Self-pay | Admitting: Internal Medicine

## 2016-09-08 ENCOUNTER — Encounter: Payer: Self-pay | Admitting: Internal Medicine
